# Patient Record
Sex: Male | Born: 1995 | Hispanic: Refuse to answer | Marital: Single
Health system: Southern US, Community
[De-identification: ages and names within clinical notes are randomized; demographics above are authoritative.]

---
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
## 2017-05-30 ENCOUNTER — Ambulatory Visit (INDEPENDENT_AMBULATORY_CARE_PROVIDER_SITE_OTHER): Payer: Self-pay | Admitting: Family Medicine

## 2017-05-30 VITALS — BP 124/64 | HR 54 | Temp 97.8°F | Resp 14

## 2017-05-30 DIAGNOSIS — M79605 Pain in left leg: Secondary | ICD-10-CM

## 2017-05-30 NOTE — Progress Notes (Signed)
Patient presents today for a vitamin D level. Patient denies any history of stress injury in the past. He is not lactose intolerant. He does not restrict anything in his diet. He has no known history of vitamin D deficiency. Patient is best while playing area in place in doors most of the time.  Exam deferred   A/P: We will draw a vitamin D level. If flow will discuss supplementation.

## 2017-05-31 LAB — VITAMIN D 25 HYDROXY (VIT D DEFICIENCY, FRACTURES): VIT D 25 HYDROXY: 41.4 ng/mL (ref 30.0–100.0)

## 2017-07-02 ENCOUNTER — Other Ambulatory Visit: Payer: Self-pay | Admitting: Family Medicine

## 2017-07-02 MED ORDER — ALBUTEROL SULFATE HFA 108 (90 BASE) MCG/ACT IN AERS: 2.0000 | INHALATION_SPRAY | Freq: Four times a day (QID) | RESPIRATORY_TRACT | 1 refills | Status: AC | PRN

## 2017-10-31 ENCOUNTER — Other Ambulatory Visit: Payer: Self-pay | Admitting: Family Medicine

## 2017-10-31 ENCOUNTER — Ambulatory Visit
Admission: RE | Admit: 2017-10-31 | Discharge: 2017-10-31 | Disposition: A | Discharge status: Home / Self Care | Payer: No Typology Code available for payment source | Source: Ambulatory Visit | Attending: Family Medicine | Admitting: Family Medicine

## 2017-10-31 DIAGNOSIS — M25472 Effusion, left ankle: Secondary | ICD-10-CM | POA: Insufficient documentation

## 2017-10-31 DIAGNOSIS — M7989 Other specified soft tissue disorders: Secondary | ICD-10-CM | POA: Insufficient documentation

## 2017-10-31 DIAGNOSIS — M25572 Pain in left ankle and joints of left foot: Secondary | ICD-10-CM

## 2018-01-25 ENCOUNTER — Other Ambulatory Visit: Payer: Self-pay | Admitting: Family Medicine

## 2018-01-25 DIAGNOSIS — M25572 Pain in left ankle and joints of left foot: Principal | ICD-10-CM

## 2018-01-25 DIAGNOSIS — G8929 Other chronic pain: Secondary | ICD-10-CM

## 2018-01-26 ENCOUNTER — Ambulatory Visit
Admission: RE | Admit: 2018-01-26 | Discharge: 2018-01-26 | Disposition: A | Discharge status: Home / Self Care | Payer: No Typology Code available for payment source | Source: Ambulatory Visit | Attending: Family Medicine | Admitting: Family Medicine

## 2018-01-26 DIAGNOSIS — X58XXXA Exposure to other specified factors, initial encounter: Secondary | ICD-10-CM | POA: Insufficient documentation

## 2018-01-26 DIAGNOSIS — S93492A Sprain of other ligament of left ankle, initial encounter: Secondary | ICD-10-CM | POA: Insufficient documentation

## 2018-01-26 DIAGNOSIS — S93412A Sprain of calcaneofibular ligament of left ankle, initial encounter: Secondary | ICD-10-CM | POA: Insufficient documentation

## 2018-01-26 DIAGNOSIS — G8929 Other chronic pain: Secondary | ICD-10-CM | POA: Diagnosis not present

## 2018-01-26 DIAGNOSIS — S93422A Sprain of deltoid ligament of left ankle, initial encounter: Secondary | ICD-10-CM | POA: Diagnosis not present

## 2018-01-26 DIAGNOSIS — M25572 Pain in left ankle and joints of left foot: Secondary | ICD-10-CM | POA: Diagnosis not present

## 2018-11-26 IMAGING — MR MR ANKLE*L* W/O CM
6 series · 39 of 40 positions shown · non-contrast
Comparison: Plain films left ankle 10/31/2017.

CLINICAL DATA: Left ankle twisting injury 4 months ago with
continued anterior and medial pain when squatting. Subsequent
encounter.

EXAM:
MRI OF THE LEFT ANKLE WITHOUT CONTRAST
TECHNIQUE: Multiplanar, multisequence MR imaging of the ankle was performed. No
intravenous contrast was administered.

[Series 3: PD fat-sat · axial · 3.0mm · 0.56mm/px · z∈[-134,+3]mm · 8 of 39 slices shown]
[im 1/39]
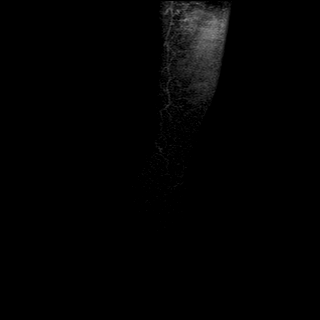
[im 6/39]
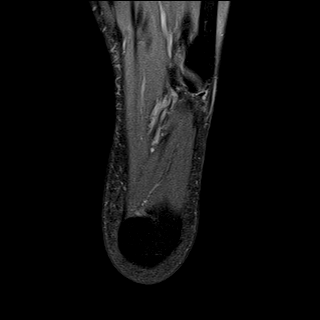
[im 11/39]
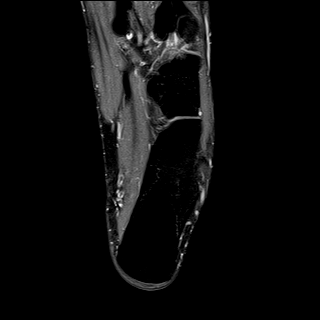
[im 17/39]
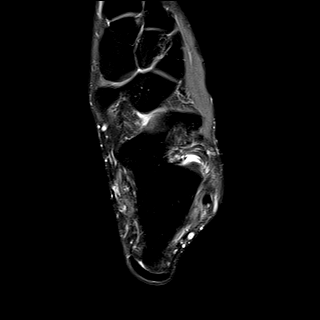
[im 22/39]
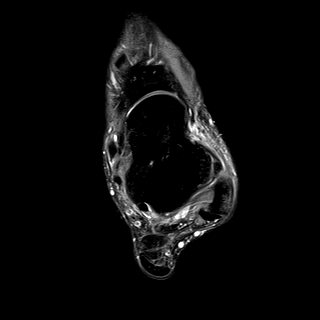
[im 28/39]
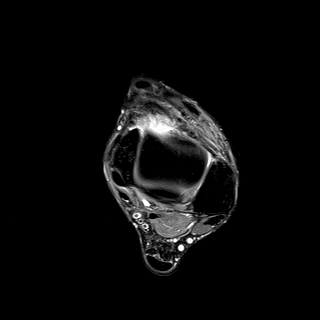
[im 33/39]
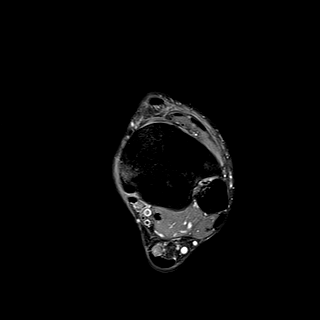
[im 39/39]
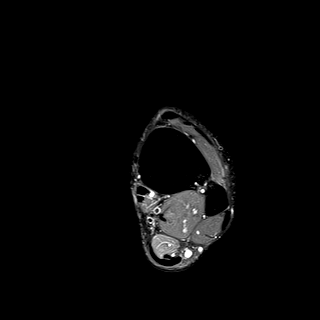

[Series 4: T2 fat-sat · axial · 3.0mm · 0.56mm/px · z∈[-134,+3]mm · 7 of 39 slices shown (1 of 3)]
[im 1/39]
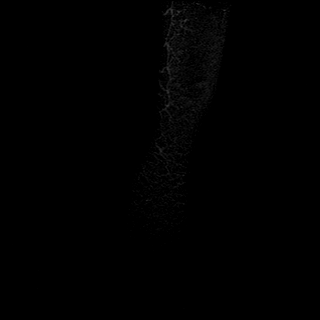
[im 7/39]
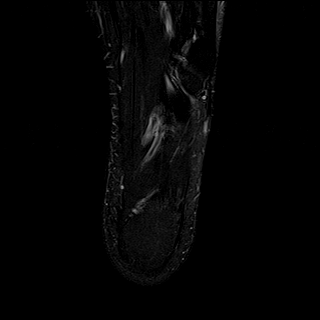
[im 13/39]
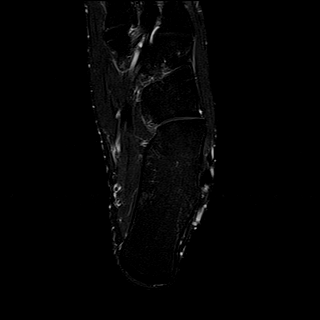
[im 20/39]
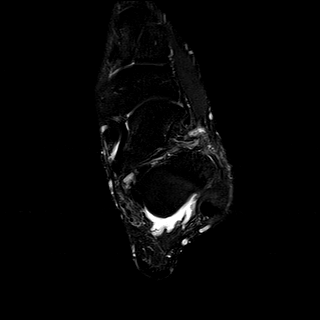
[im 26/39]
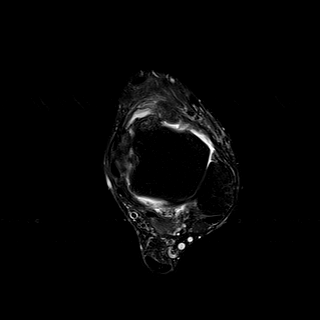
[im 32/39]
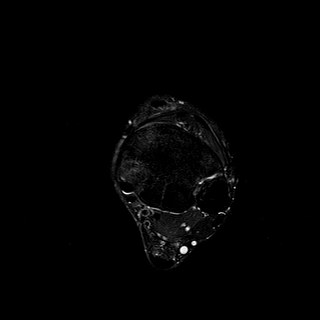
[im 39/39]
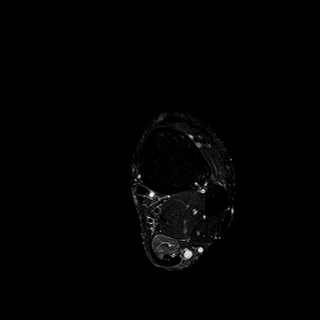

[Series 5: T2 fat-sat · coronal · 3.0mm · 0.56mm/px · 7 of 39 slices shown (2 of 3)]
[im 1/39]
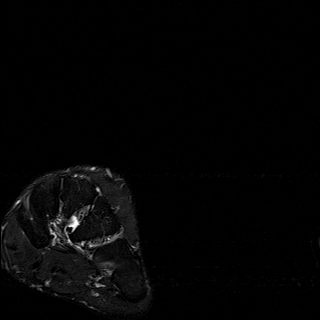
[im 7/39]
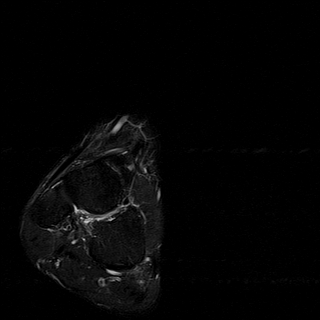
[im 13/39]
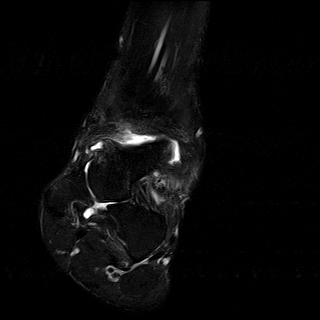
[im 20/39]
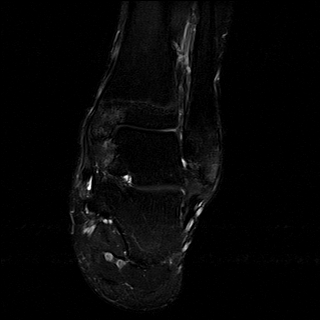
[im 26/39]
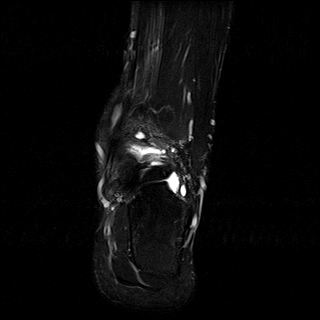
[im 32/39]
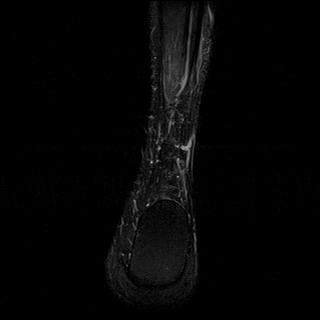
[im 39/39]
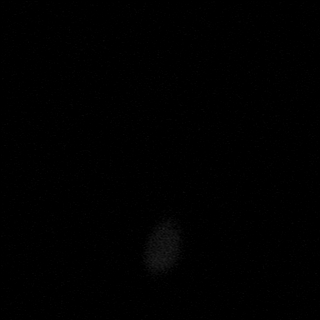

[Series 6: T1 · sagittal · 3.0mm · 0.56mm/px · 6 of 32 slices shown]
[im 1/32]
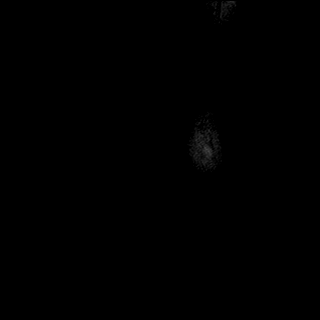
[im 7/32]
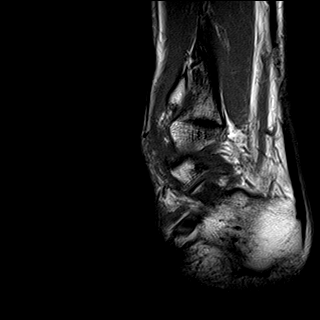
[im 13/32]
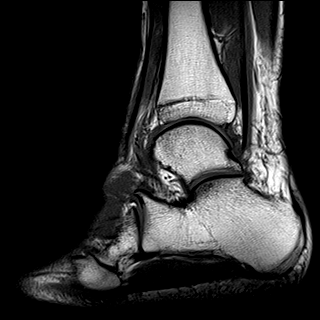
[im 19/32]
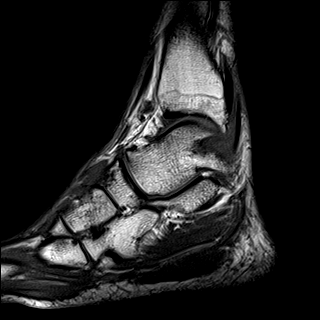
[im 25/32]
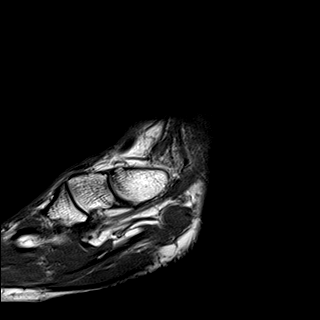
[im 32/32]
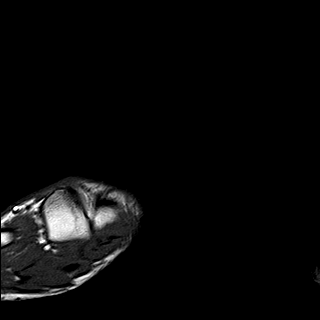

[Series 7: T2 fat-sat · sagittal · 3.0mm · 0.70mm/px · 6 of 32 slices shown (3 of 3)]
[im 1/32]
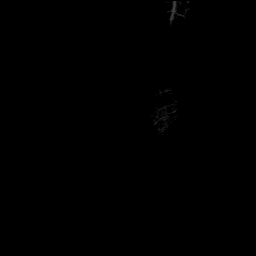
[im 7/32]
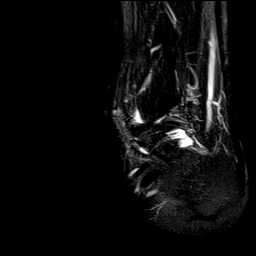
[im 13/32]
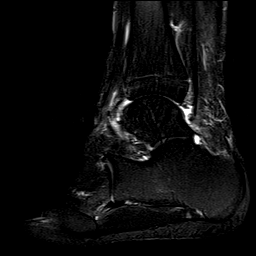
[im 19/32]
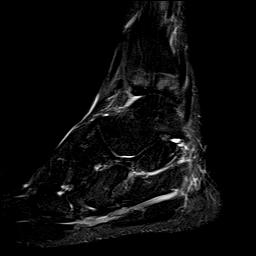
[im 25/32]
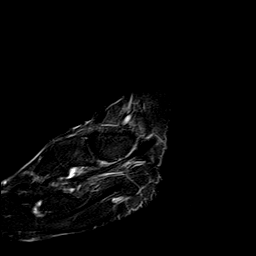
[im 32/32]
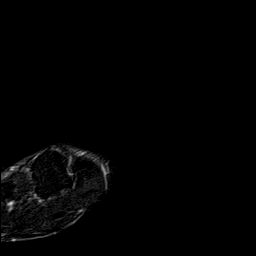

[Series 8: STIR · sagittal · 3.0mm · 0.35mm/px · 5 of 32 slices shown]
[im 1/32]
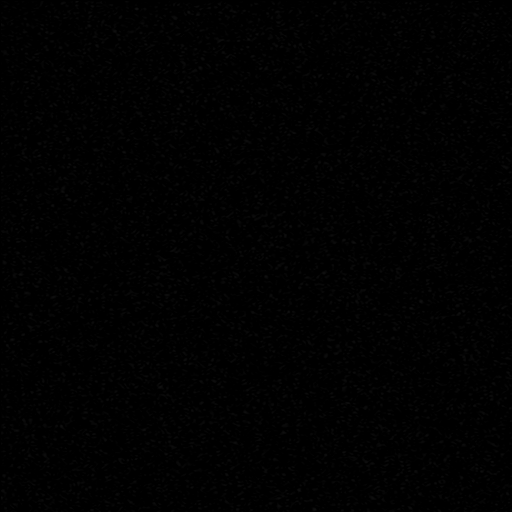
[im 7/32]
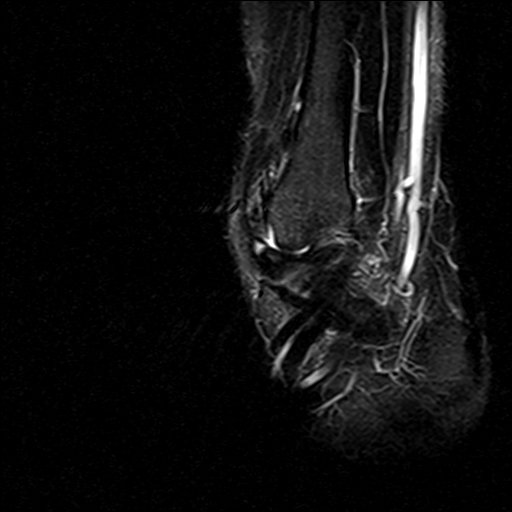
[im 13/32]
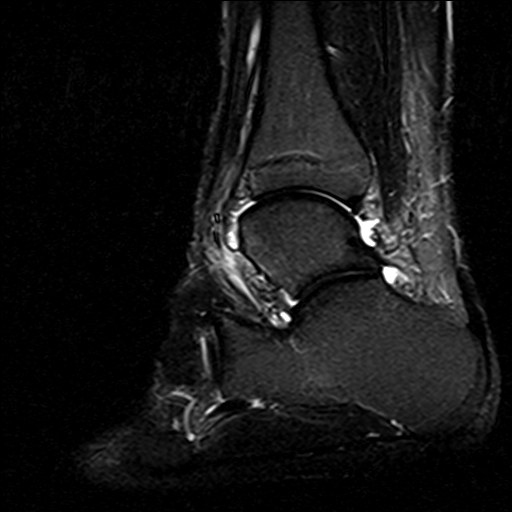
[im 19/32]
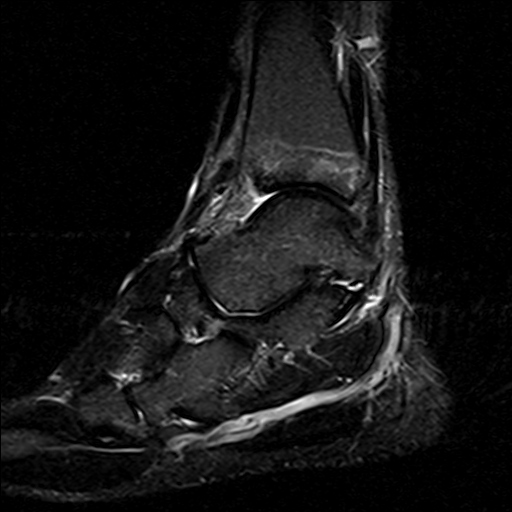
[im 25/32]
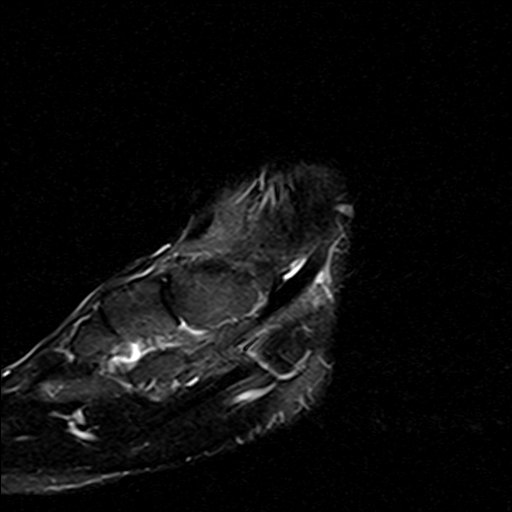

[39 of 40 positions shown; findings below may reference images not displayed]

FINDINGS: TENDONS

Peroneal: Intact.

Posteromedial: Intact.

Anterior: Intact.

Achilles: Intact.

Plantar Fascia: Normal.

LIGAMENTS

Lateral: The anterior talofibular ligament is torn from the fibula.
The calcaneofibular ligament is also torn from the fibula. Lateral
ligaments are otherwise unremarkable.

Medial: There is abnormal signal within the deep fibers of the
deltoid ligament consistent with sprain but the ligament appears to
be intact. Superficial fibers are intact.

CARTILAGE

Ankle Joint: Negative.  No osteochondral lesion of the talar dome.

Subtalar Joints/Sinus Tarsi: Negative.

Bones: Marrow edema is seen in both the medial and lateral malleoli
and in the medial process of the talus consistent with contusions.
Signal intensity is intermediate. No fracture.

Other: None.
IMPRESSION: Complete ATFL and calcaneofibular ligament tears from the fibula.

Sprain of the deep fibers of the deltoid ligament. There appear to
be intact fibers present.

Findings consistent with resolving bone contusions in the distal
fibula, medial malleolus and medial process of the talus.
